# Patient Record
Sex: Male | Born: 1980 | Race: Black or African American | Hispanic: No | Marital: Single
Health system: Southern US, Community
[De-identification: ages and names within clinical notes are randomized; demographics above are authoritative.]

## PROBLEM LIST (undated history)

## (undated) DIAGNOSIS — M5430 Sciatica, unspecified side: Secondary | ICD-10-CM

---

## 2019-08-22 ENCOUNTER — Encounter (HOSPITAL_COMMUNITY): Payer: Self-pay

## 2019-08-22 ENCOUNTER — Other Ambulatory Visit: Payer: Self-pay

## 2019-08-22 ENCOUNTER — Emergency Department (HOSPITAL_COMMUNITY)
Admission: EM | Admit: 2019-08-22 | Discharge: 2019-08-22 | Disposition: A | Discharge status: Home / Self Care | Payer: Self-pay | Attending: Emergency Medicine | Admitting: Emergency Medicine

## 2019-08-22 ENCOUNTER — Emergency Department (HOSPITAL_COMMUNITY): Payer: Self-pay

## 2019-08-22 DIAGNOSIS — Y929 Unspecified place or not applicable: Secondary | ICD-10-CM | POA: Insufficient documentation

## 2019-08-22 DIAGNOSIS — S29019A Strain of muscle and tendon of unspecified wall of thorax, initial encounter: Secondary | ICD-10-CM | POA: Insufficient documentation

## 2019-08-22 DIAGNOSIS — Y998 Other external cause status: Secondary | ICD-10-CM | POA: Insufficient documentation

## 2019-08-22 DIAGNOSIS — Y939 Activity, unspecified: Secondary | ICD-10-CM | POA: Insufficient documentation

## 2019-08-22 DIAGNOSIS — X58XXXA Exposure to other specified factors, initial encounter: Secondary | ICD-10-CM | POA: Insufficient documentation

## 2019-08-22 DIAGNOSIS — T148XXA Other injury of unspecified body region, initial encounter: Secondary | ICD-10-CM

## 2019-08-22 HISTORY — DX: Sciatica, unspecified side: M54.30

## 2019-08-22 MED ORDER — METHOCARBAMOL 500 MG PO TABS: 500.0000 mg | ORAL_TABLET | Freq: Two times a day (BID) | ORAL | 0 refills | Status: AC

## 2019-08-22 MED ORDER — NAPROXEN 500 MG PO TABS: 500.0000 mg | ORAL_TABLET | Freq: Two times a day (BID) | ORAL | 0 refills | Status: AC

## 2019-08-22 MED ORDER — NAPROXEN 500 MG PO TABS
500.0000 mg | ORAL_TABLET | Freq: Once | ORAL | Status: AC
  Administered 2019-08-22: 500 mg via ORAL
  Filled 2019-08-22: qty 1

## 2019-08-22 NOTE — ED Provider Notes (Signed)
[facility] DEPT Provider Note   CSN: [id number] Arrival date & time: 08/22/19  1122     History Chief Complaint  Patient presents with  . Back Pain    Charles Pitts is a 39 y.o. male.  HPI    39 year old male with history of sciatica history comes in a chief complaint of back pain.  Patient reports has been having pain in his back for the last 2 weeks.  The pain is primarily located in the upper part of his back and neck.  He has discomfort anytime he turns his head, or moves his arm.  Stable tasks such as removing T-shirt he is extremely painful for him.  He has been taking Tylenol, applying warm compresses, performing stretching exercises but the pain has been constant.  Patient denies any associated weakness in the upper extremities, numbness.  Patient works for a [employer], broadcasting/film/video, but does not recall any specific evoking factor.  Past Medical History:  Diagnosis Date  . Sciatica     There are no problems to display for this patient.   History reviewed. No pertinent surgical history.     History reviewed. No pertinent family history.  Social History   Tobacco Use  . Smoking status: Not on file  Substance Use Topics  . Alcohol use: Not on file  . Drug use: Not on file    Home Medications Prior to Admission medications   Medication Sig Start Date End Date Taking? Authorizing Provider  methocarbamol (ROBAXIN) 500 MG tablet Take 1 tablet (500 mg total) by mouth 2 (two) times daily. 08/22/19   Varney Biles, MD  naproxen (NAPROSYN) 500 MG tablet Take 1 tablet (500 mg total) by mouth 2 (two) times daily. 08/22/19   Varney Biles, MD    Allergies    Penicillins  Review of Systems   Review of Systems  Constitutional: Positive for activity change.  Musculoskeletal: Positive for back pain.  Neurological: Negative for weakness and numbness.    Physical Exam Updated Vital Signs BP 120/82 (BP Location: Right Arm)   Pulse 71   Temp  98.1 F (36.7 C) (Oral)   Resp 18   SpO2 96%   Physical Exam Vitals and nursing note reviewed.  Constitutional:      Appearance: He is well-developed.  HENT:     Head: Atraumatic.  Cardiovascular:     Rate and Rhythm: Normal rate.  Pulmonary:     Effort: Pulmonary effort is normal.  Musculoskeletal:        General: Tenderness present. No deformity.     Cervical back: Neck supple.     Comments: Patient has reproducible tenderness over the scapular region and  mid thoracic spine.  His tenderness is reproduced with forward flexion and abduction of his upper extremities.   Skin:    General: Skin is warm.  Neurological:     Mental Status: He is alert and oriented to person, place, and time.     ED Results / Procedures / Treatments   Labs (all labs ordered are listed, but only abnormal results are displayed) Labs Reviewed - No data to display  EKG None  Radiology DG Cervical Spine Complete  Result Date: 08/22/2019 CLINICAL DATA:  Cervicalgia EXAM: CERVICAL SPINE - COMPLETE 4+ VIEW COMPARISON:  None. FINDINGS: Frontal, lateral, open-mouth odontoid, and bilateral oblique views were obtained. There is no fracture or spondylolisthesis. Prevertebral soft tissues and predental space regions are normal. Disc spaces appear unremarkable. There is no appreciable  exit foraminal narrowing on the oblique views. There is mild upper thoracic levoscoliosis. There is reversal of lordotic curvature. Lung apices are clear. IMPRESSION: Reversal of lordotic curvature and mild upper thoracic levoscoliosis. Suspect a degree of muscle spasm. No fracture or spondylolisthesis. No appreciable arthropathy. Electronically Signed   By: Bretta Bang III M.D.   On: 08/22/2019 13:08   DG Thoracic Spine 2 View  Result Date: 08/22/2019 CLINICAL DATA:  Dorsalgia EXAM: THORACIC SPINE 3 VIEWS COMPARISON:  None. FINDINGS: Frontal, lateral, and swimmer's views were obtained. There is mild upper thoracic  levoscoliosis. There is no fracture or spondylolisthesis. Disc spaces appear normal. No erosive change or paraspinous lesion. Visualized lungs are clear. IMPRESSION: Upper thoracic levoscoliosis. No fracture or spondylolisthesis. No appreciable arthropathy. Electronically Signed   By: Bretta Bang III M.D.   On: 08/22/2019 13:08    Procedures Procedures (including critical care time)  Medications Ordered in ED Medications  naproxen (NAPROSYN) tablet 500 mg (500 mg Oral Given 08/22/19 1237)    ED Course  I have reviewed the triage vital signs and the nursing notes.  Pertinent labs & imaging results that were available during my care of the patient were reviewed by me and considered in my medical decision making (see chart for details).    MDM Rules/Calculators/A&P                      39 year old comes in a chief complaint of back pain. He works for a [employer] and reports severe pain for the last several days despite trying to stretch it out.  He has no medical issues.  He has no neurologic complaints or deficits.  He has no chest pain, shortness of breath.  We do not think based on exam that there is any internal injuries that are life-threatening.  It appears that the symptoms are likely musculoskeletal in nature.  We will reinforce continued conservative management. [facility] follow-up provided.  Also advised to see chiropractor if he is unable to see [facility] given the lack of insurance.  Patient will return to the ER if his symptoms get worse or if he starts developing new neurologic symptoms.   Final Clinical Impression(s) / ED Diagnoses Final diagnoses:  Muscle strain    Rx / DC Orders ED Discharge Orders         Ordered    naproxen (NAPROSYN) 500 MG tablet  2 times daily     08/22/19 1433    methocarbamol (ROBAXIN) 500 MG tablet  2 times daily     08/22/19 1433           Derwood Kaplan, MD 08/22/19 1434

## 2019-08-22 NOTE — ED Notes (Signed)
Pt refused discharge vital signs

## 2019-08-22 NOTE — Discharge Instructions (Addendum)
You are seen in the ER for the back pain. Our x-rays are not showing any fractures.  We are pretty sure that you have a strain of your back muscles.  Please read the instructions provided. Take the medication as prescribed.

## 2019-08-22 NOTE — ED Triage Notes (Signed)
Pt reports mid back pain to neck pain x2 weeks. Pt denies any injury that he can remember. Pt reports taking tylenol without relief. Pt reports lifting furniture at work despite the pain.

## 2019-09-30 ENCOUNTER — Other Ambulatory Visit: Payer: Self-pay

## 2019-09-30 ENCOUNTER — Emergency Department (HOSPITAL_COMMUNITY)
Admission: EM | Admit: 2019-09-30 | Discharge: 2019-09-30 | Disposition: A | Discharge status: Home / Self Care | Payer: Self-pay | Attending: Emergency Medicine | Admitting: Emergency Medicine

## 2019-09-30 DIAGNOSIS — Y9389 Activity, other specified: Secondary | ICD-10-CM | POA: Insufficient documentation

## 2019-09-30 DIAGNOSIS — X500XXA Overexertion from strenuous movement or load, initial encounter: Secondary | ICD-10-CM | POA: Insufficient documentation

## 2019-09-30 DIAGNOSIS — M5432 Sciatica, left side: Secondary | ICD-10-CM | POA: Insufficient documentation

## 2019-09-30 MED ORDER — PREDNISONE 20 MG PO TABS: 40.0000 mg | ORAL_TABLET | Freq: Every day | ORAL | 0 refills | Status: AC

## 2019-09-30 MED ORDER — CYCLOBENZAPRINE HCL 10 MG PO TABS: 10.0000 mg | ORAL_TABLET | Freq: Two times a day (BID) | ORAL | 0 refills | Status: AC | PRN

## 2019-09-30 NOTE — ED Provider Notes (Signed)
[facility]-EMERGENCY DEPT [facility] Emergency Department Provider Note MRN:  [id number]  Arrival date & time: 09/30/19     Chief Complaint   Back Pain   History of Present Illness   Charles Pitts is a 39 y.o. year-old male with a history of sciatica presenting to the ED with chief complaint of back pain.  2 or 3 days of left lower back pain with radiation to the back of the left leg.  Began a few hours after moving heavy furniture.  Pain is progressively worsened, now is severe, worse with motion, worse with certain positions.  Denies numbness or weakness, no bowel or bladder dysfunction, no fever, no other complaints.  Review of Systems  A complete 10 system review of systems was obtained and all systems are negative except as noted in the HPI and PMH.   Patient's Health History    Past Medical History:  Diagnosis Date  . Sciatica     No past surgical history on file.  No family history on file.  Social History   Socioeconomic History  . Marital status: Single    Spouse name: Not on file  . Number of children: Not on file  . Years of education: Not on file  . Highest education level: Not on file  Occupational History  . Not on file  Tobacco Use  . Smoking status: Not on file  Substance and Sexual Activity  . Alcohol use: Not on file  . Drug use: Not on file  . Sexual activity: Not on file  Other Topics Concern  . Not on file  Social History Narrative  . Not on file   Social Determinants of Health   Financial Resource Strain:   . Difficulty of Paying Living Expenses: Not on file  Food Insecurity:   . Worried About Programme researcher, broadcasting/film/video in the Last Year: Not on file  . Ran Out of Food in the Last Year: Not on file  Transportation Needs:   . Lack of Transportation (Medical): Not on file  . Lack of Transportation (Non-Medical): Not on file  Physical Activity:   . Days of Exercise per Week: Not on file  . Minutes of Exercise per Session: Not on file  Stress:    . Feeling of Stress : Not on file  Social Connections:   . Frequency of Communication with Friends and Family: Not on file  . Frequency of Social Gatherings with Friends and Family: Not on file  . Attends Religious Services: Not on file  . Active Member of Clubs or Organizations: Not on file  . Attends Banker Meetings: Not on file  . Marital Status: Not on file  Intimate Partner Violence:   . Fear of Current or Ex-Partner: Not on file  . Emotionally Abused: Not on file  . Physically Abused: Not on file  . Sexually Abused: Not on file     Physical Exam   Vitals:   09/30/19 1127  BP: (!) 136/91  Pulse: 72  Resp: 18  Temp: 98.6 F (37 C)  SpO2: 99%    CONSTITUTIONAL: Well-appearing, NAD NEURO:  Alert and oriented x 3, no focal deficits EYES:  eyes equal and reactive ENT/NECK:  no LAD, no JVD CARDIO: Regular rate, well-perfused, normal S1 and S2 PULM:  CTAB no wheezing or rhonchi GI/GU:  normal bowel sounds, non-distended, non-tender MSK/SPINE:  No gross deformities, no edema; bilateral positive straight leg test SKIN:  no rash, atraumatic PSYCH:  Appropriate speech  and behavior  *Additional and/or pertinent findings included in MDM below  Diagnostic and Interventional Summary    EKG Interpretation  Date/Time:    Ventricular Rate:    PR Interval:    QRS Duration:   QT Interval:    QTC Calculation:   R Axis:     Text Interpretation:        Cardiac Monitoring Interpretation:  Labs Reviewed - No data to display  No orders to display    Medications - No data to display   Procedures  /  Critical Care Procedures  ED Course and Medical Decision Making  I have reviewed the triage vital signs, the nursing notes, and pertinent available records from the EMR.  Pertinent labs & imaging results that were available during my care of the patient were reviewed by me and considered in my medical decision making (see below for details).     No red flag  symptoms to suggest myelopathy, clearly consistent with musculoskeletal ideology and radicular pain.  Appropriate for short burst of steroids and orthopedic follow-up if not improving.    Barth Kirks. Sedonia Small, [place] [email or web address]  Final Clinical Impressions(s) / ED Diagnoses     ICD-10-CM   1. Sciatica of left side  M54.32     ED Discharge Orders         Ordered    cyclobenzaprine (FLEXERIL) 10 MG tablet  2 times daily PRN     09/30/19 1157    predniSONE (DELTASONE) 20 MG tablet  Daily     09/30/19 1157           Discharge Instructions Discussed with and Provided to Patient:     Discharge Instructions     You were evaluated in the Emergency Department and after careful evaluation, we did not find any emergent condition requiring admission or further testing in the hospital.  Your exam/testing today was overall reassuring.  We suspect that you have a disc herniation causing irritation and pain to the sciatic nerve.  Please take the prednisone medication as directed.  You could also try the Flexeril muscle relaxer at home.  If your pain continues after 2 or 3 weeks, we recommend follow-up with an orthopedic specialist.  Please return to the Emergency Department if you experience any worsening of your condition.  We encourage you to follow up with a primary care provider.  Thank you for allowing [place] to be a part of your care.       Maudie Flakes, MD 09/30/19 1201

## 2019-09-30 NOTE — ED Triage Notes (Signed)
Patient states he is having back pain, middle of left side of back, radiating toward left leg. Pain rated 10/10. When he walks, pain shoots down leg. Patient reports he cannot stand up quickly, it hurts to sit down also.

## 2019-09-30 NOTE — Discharge Instructions (Addendum)
You were evaluated in the Emergency Department and after careful evaluation, we did not find any emergent condition requiring admission or further testing in the hospital.  Your exam/testing today was overall reassuring.  We suspect that you have a disc herniation causing irritation and pain to the sciatic nerve.  Please take the prednisone medication as directed.  You could also try the Flexeril muscle relaxer at home.  If your pain continues after 2 or 3 weeks, we recommend follow-up with an orthopedic specialist.  Please return to the Emergency Department if you experience any worsening of your condition.  We encourage you to follow up with a primary care provider.  Thank you for allowing [place] to be a part of your care.

## 2020-05-01 IMAGING — CR DG CERVICAL SPINE COMPLETE 4+V
5 series · 5 of 5 positions shown · non-contrast
Comparison: None.

CLINICAL DATA: Cervicalgia

EXAM:
CERVICAL SPINE - COMPLETE 4+ VIEW

[w cervical spine lat]
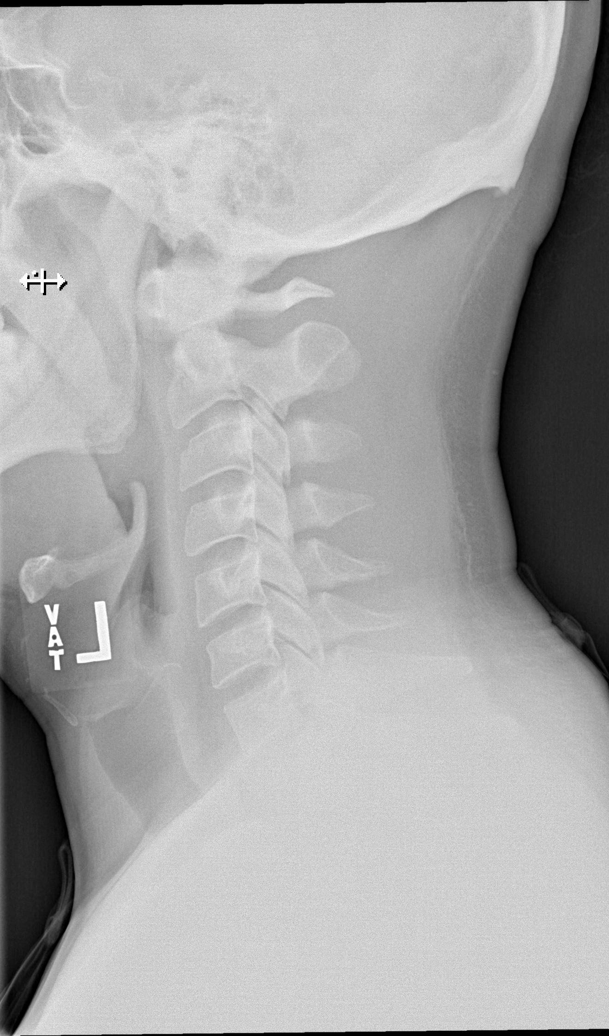

[w cervical spine ap_obl (1 of 2)]
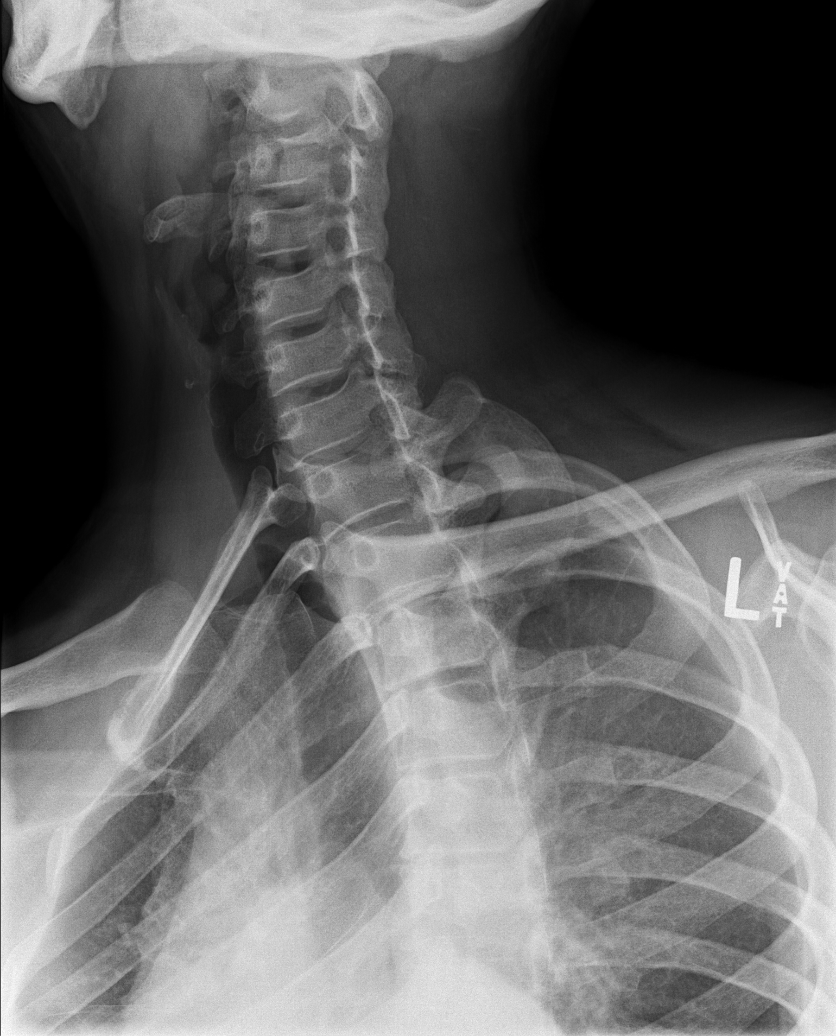

[w cervical spine ap_obl (2 of 2)]
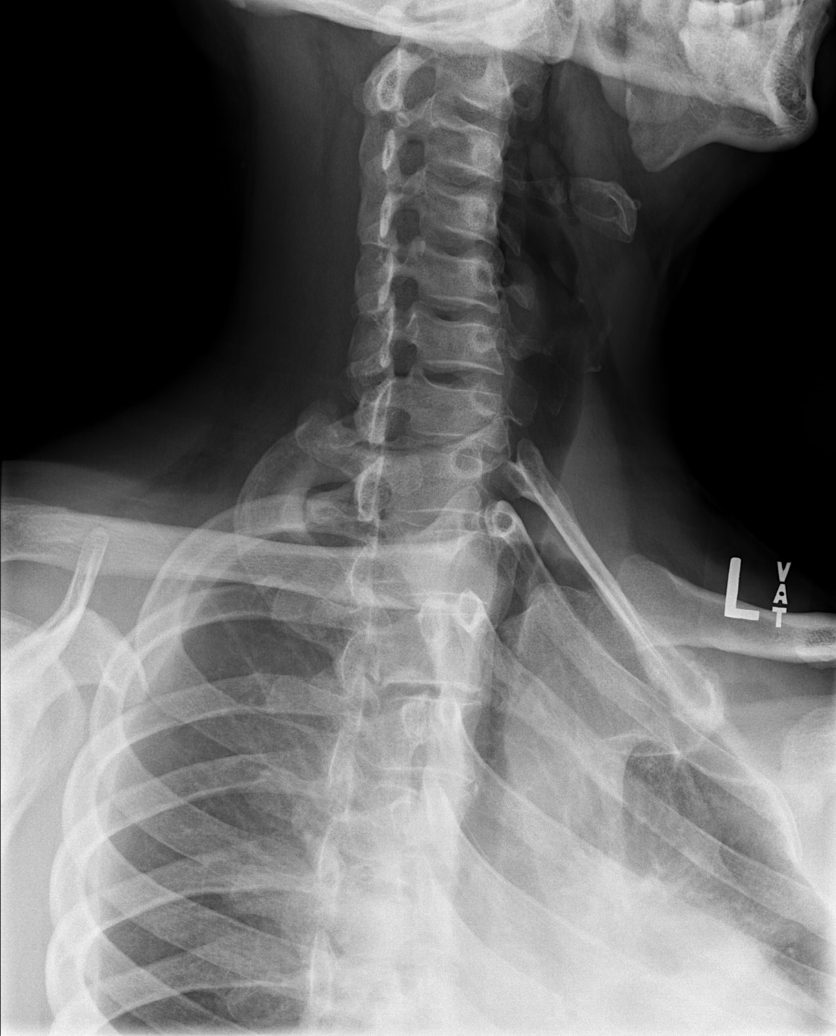

[w cervical spine ap]
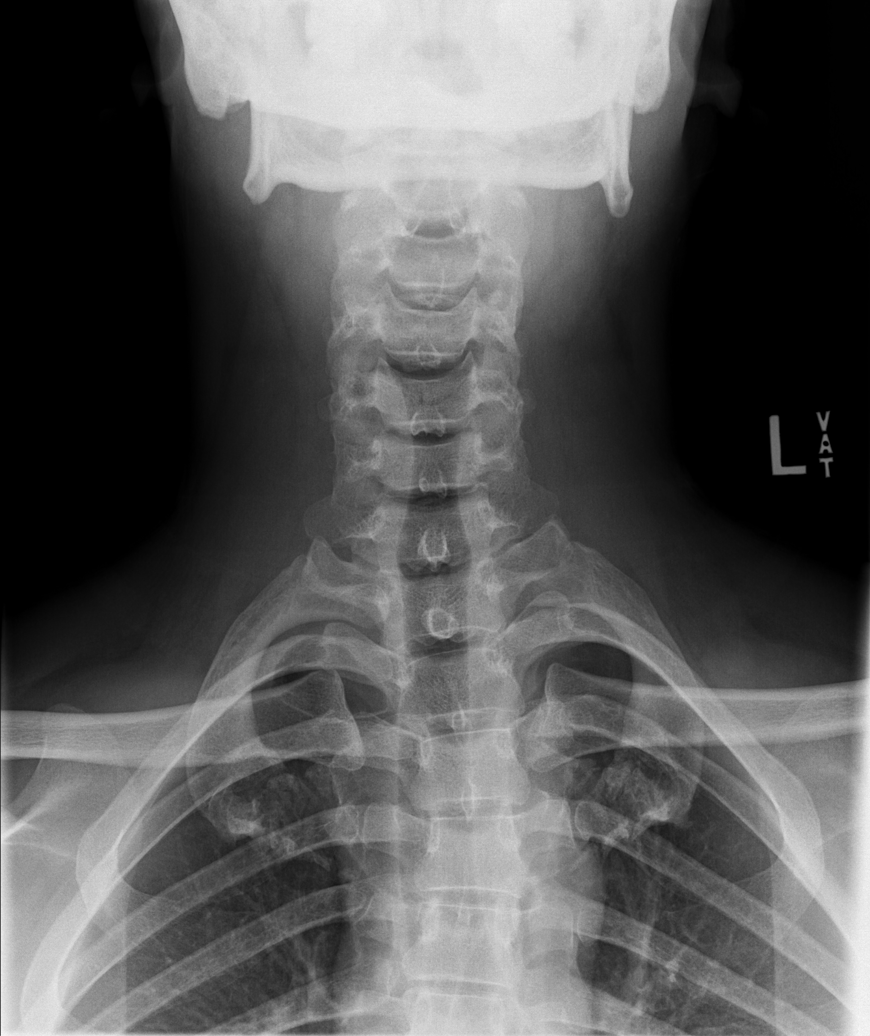

[w cervical spine odontoid]
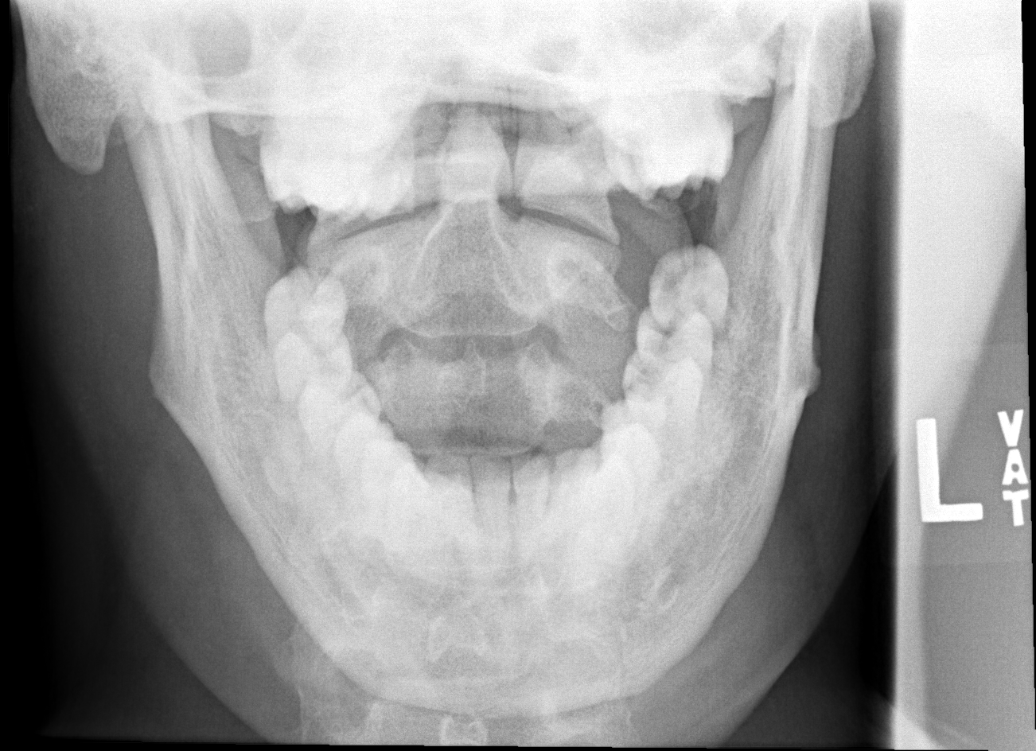

[5 of 5 positions shown; findings below may reference images not displayed]

FINDINGS: Frontal, lateral, open-mouth odontoid, and bilateral oblique views
were obtained. There is no fracture or spondylolisthesis.
Prevertebral soft tissues and predental space regions are normal.
Disc spaces appear unremarkable. There is no appreciable exit
foraminal narrowing on the oblique views.

There is mild upper thoracic levoscoliosis. There is reversal of
lordotic curvature. Lung apices are clear.
IMPRESSION: Reversal of lordotic curvature and mild upper thoracic
levoscoliosis. Suspect a degree of muscle spasm. No fracture or
spondylolisthesis. No appreciable arthropathy.
